# Patient Record
Sex: Female | Born: 1940 | Race: White | Hispanic: No | Marital: Married | Smoking: Never smoker
Health system: Southern US, Community
[De-identification: ages and names within clinical notes are randomized; demographics above are authoritative.]

## PROBLEM LIST (undated history)

## (undated) DIAGNOSIS — H409 Unspecified glaucoma: Secondary | ICD-10-CM

## (undated) DIAGNOSIS — D649 Anemia, unspecified: Secondary | ICD-10-CM

## (undated) HISTORY — PX: OTHER SURGICAL HISTORY: SHX169

## (undated) HISTORY — DX: Unspecified glaucoma: H40.9

## (undated) HISTORY — PX: SHOULDER SURGERY: SHX246

## (undated) HISTORY — DX: Anemia, unspecified: D64.9

## (undated) HISTORY — PX: SPINE SURGERY: SHX786

---

## 2008-01-05 ENCOUNTER — Ambulatory Visit: Payer: Self-pay | Admitting: Family Medicine

## 2010-01-31 IMAGING — CR DG FOOT COMPLETE 3+V*L*
1 series · 3 of 3 positions shown · non-contrast
Comparison: none

REASON FOR EXAM: pain r/o FB
COMMENTS:

[Series 1: view not recorded · 0.17mm/px · 3 of 3 slices shown]
[im 1/3]
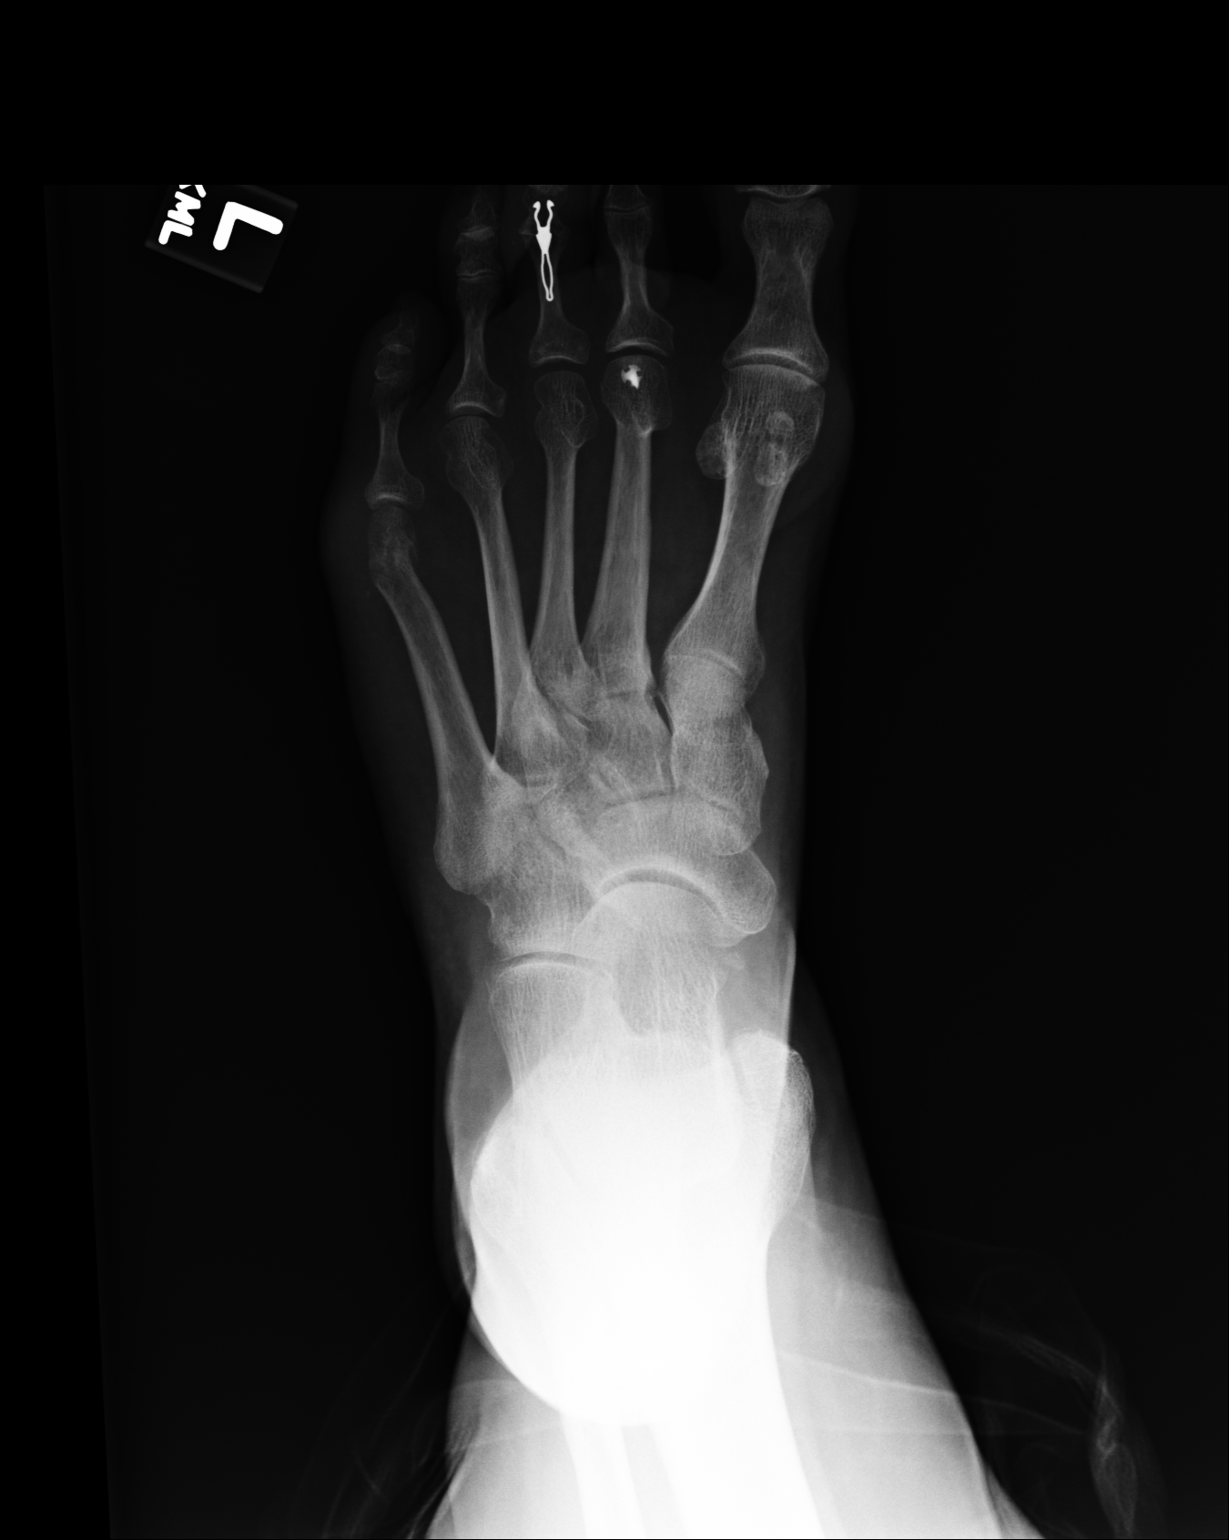
[im 2/3]
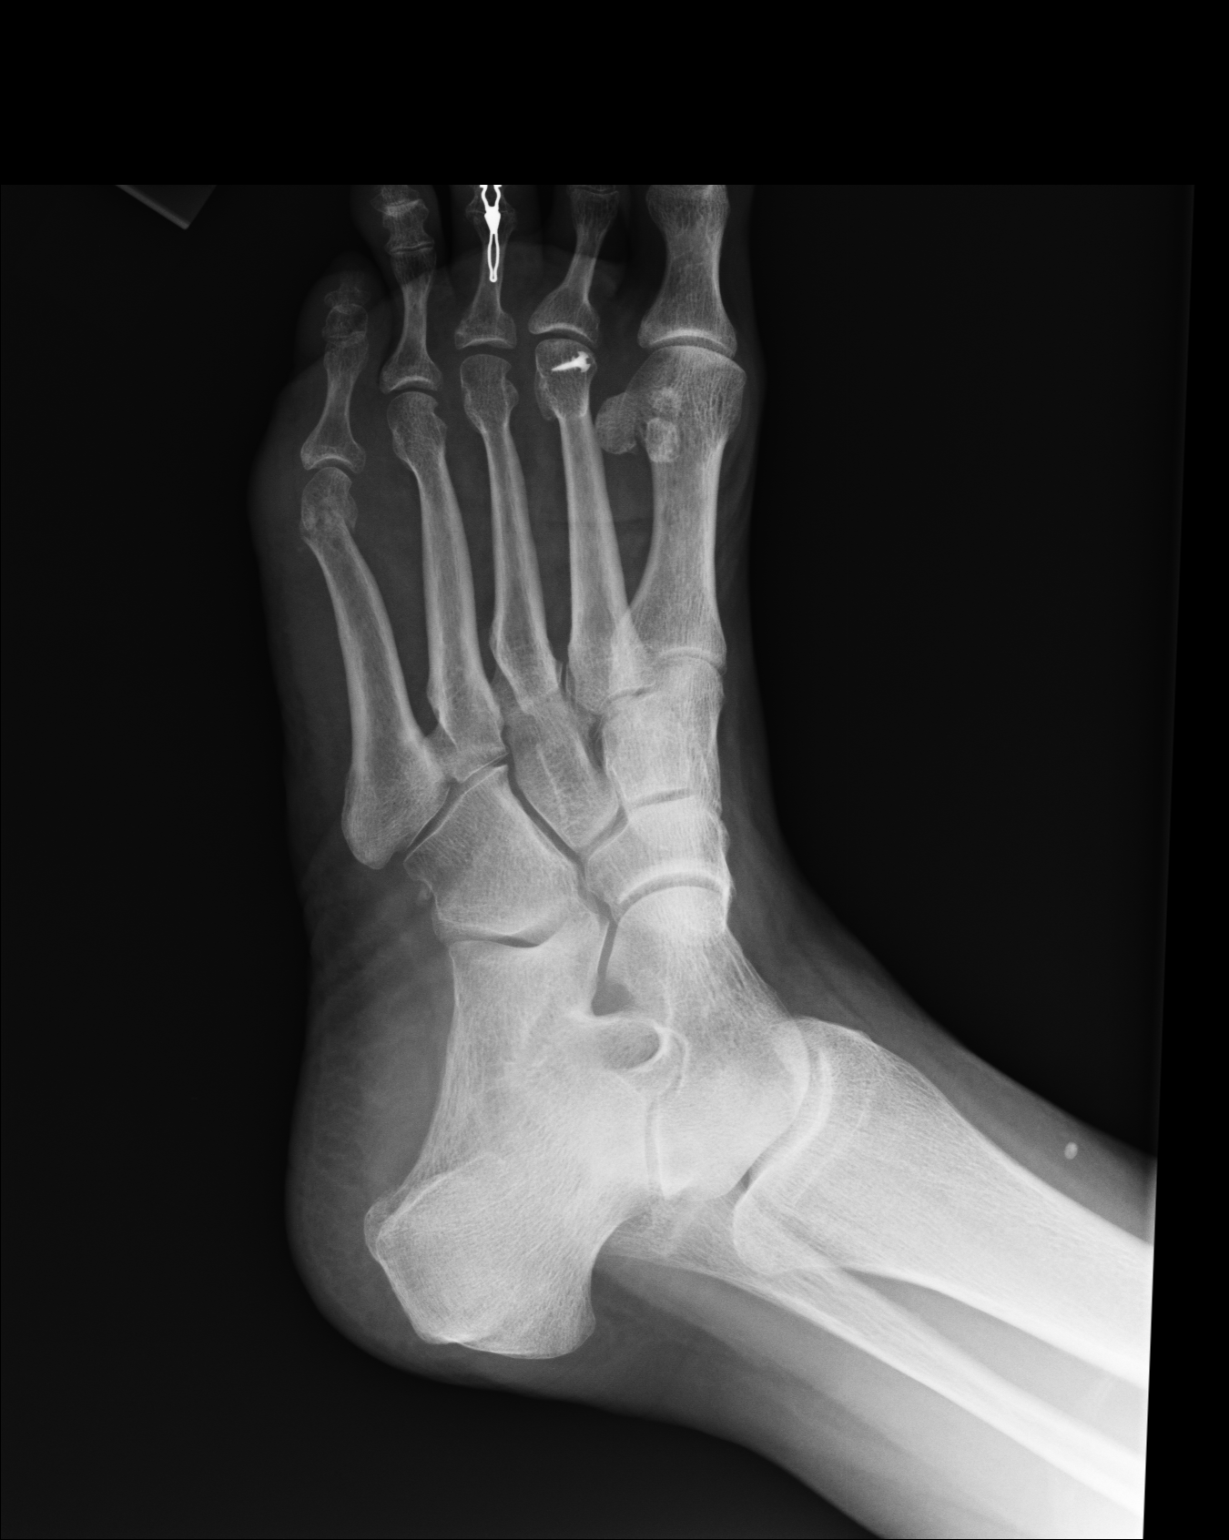
[im 3/3]
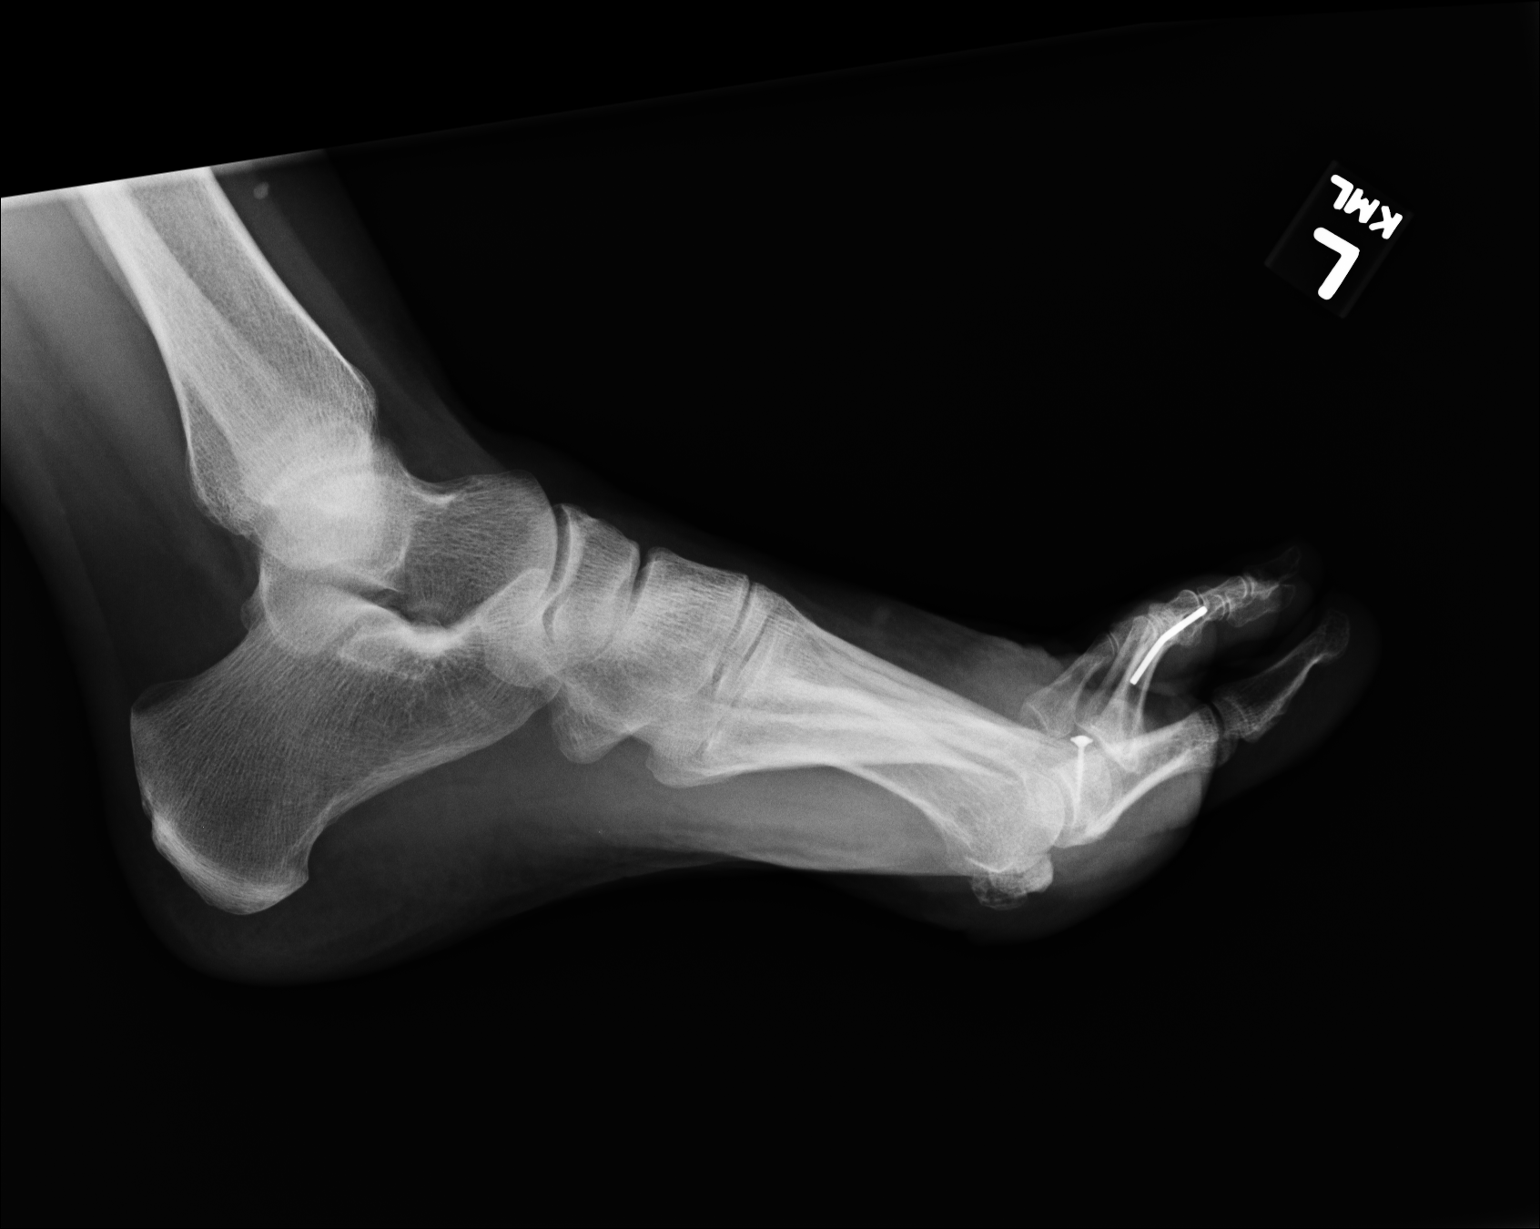

[3 of 3 positions shown; findings below may reference images not displayed]

PROCEDURE:     MDR - MDR FOOT LT COMP W/OBLQUES  - January 05, 2008 [DATE]

RESULT:     The patient is complaining of foot pain. The history is limited
otherwise. There has been prior surgery involving the proximal and middle
phalanges of the third toe. There is radiodense material that appears to
reflect a metallic screw through the head of the second metatarsal. I do not
see other foreign bodies in the soft tissues of the foot. There is likely a
phlebolith in the anterior aspect of the lower shin. There is an abnormal
appearance of the head of the fifth metatarsal. This may reflect an acute
fracture in the appropriate clinical setting.
IMPRESSION: 1. I am worried that there may be a fracture of the distal aspect of the
fifth metatarsal.
2. There are postsurgical changes with metallic screws and wires present
involving the third toe and the head of the second metatarsal. No other
foreign bodies are identified.

## 2014-03-25 ENCOUNTER — Ambulatory Visit: Payer: Medicare Other | Admitting: Podiatrist

## 2014-04-15 ENCOUNTER — Encounter: Payer: Self-pay | Admitting: Podiatrist

## 2014-04-15 ENCOUNTER — Ambulatory Visit (INDEPENDENT_AMBULATORY_CARE_PROVIDER_SITE_OTHER): Payer: Medicare Other | Admitting: Podiatrist

## 2014-04-15 VITALS — BP 149/86 | HR 79 | Resp 10 | Ht 66.0 in | Wt 180.0 lb

## 2014-04-15 DIAGNOSIS — L84 Corns and callosities: Secondary | ICD-10-CM

## 2014-04-15 DIAGNOSIS — M216X2 Other acquired deformities of left foot: Secondary | ICD-10-CM | POA: Diagnosis not present

## 2014-04-15 DIAGNOSIS — Q828 Other specified congenital malformations of skin: Secondary | ICD-10-CM | POA: Diagnosis not present

## 2014-04-15 NOTE — Progress Notes (Deleted)
   Subjective:    Patient ID: Danielle Compton, female    DOB: 1940/04/15, 74 y.o.   MRN: [id number]  HPI    Review of Systems  All other systems reviewed and are negative.      Objective:   Physical Exam        Assessment & Plan:

## 2014-04-28 NOTE — Progress Notes (Signed)
Chief Complaint  Patient presents with  . Callouses    "My left foot has pain in it." left 4th plantar inter MPJ space     HPI: Patient is 74 y.o. female who presents today for pain submet 4 of the left foot.  No trauma or injury related.   Review of systems is negative.   No Known Allergies  Physical Exam  Patient is awake, alert, and oriented x 3.  In no acute distress.  Vascular status is intact with palpable pedal pulses at 2/4 DP and PT bilateral and capillary refill time within normal limits. Neurological sensation is also intact bilaterally via Semmes Weinstein monofilament at 5/5 sites. Light touch, vibratory sensation, Achilles tendon reflex is intact. Dermatological exam reveals skin color, turger and texture as normal. No open lesions present.  Musculature intact with dorsiflexion, plantarflexion, inversion, eversion.  Plantar flexed metatarsal is present left 4th metatarsal head. Callus is present in the area as well. Intact integument is present post debridement.   Assessment: callus submet 4 left  Plan: debridement of lesion accomplished. Recommended padding and offloading.  She will be seen back prn.

## 2014-05-27 ENCOUNTER — Ambulatory Visit: Payer: Medicare Other | Admitting: Podiatrist

## 2017-10-29 ENCOUNTER — Other Ambulatory Visit: Payer: Self-pay | Admitting: Orthopedic Surgery

## 2017-10-29 DIAGNOSIS — M48061 Spinal stenosis, lumbar region without neurogenic claudication: Secondary | ICD-10-CM

## 2017-10-29 DIAGNOSIS — M545 Low back pain, unspecified: Secondary | ICD-10-CM

## 2017-10-29 DIAGNOSIS — M4326 Fusion of spine, lumbar region: Secondary | ICD-10-CM

## 2017-11-02 ENCOUNTER — Ambulatory Visit: Payer: Self-pay

## 2017-11-05 ENCOUNTER — Ambulatory Visit
Admission: RE | Admit: 2017-11-05 | Discharge: 2017-11-05 | Disposition: A | Discharge status: Home / Self Care | Payer: Medicare Other | Source: Ambulatory Visit | Attending: Orthopedic Surgery | Admitting: Orthopedic Surgery

## 2017-11-05 DIAGNOSIS — I700 Atherosclerosis of aorta: Secondary | ICD-10-CM | POA: Diagnosis not present

## 2017-11-05 DIAGNOSIS — M545 Low back pain, unspecified: Secondary | ICD-10-CM

## 2017-11-05 DIAGNOSIS — M48061 Spinal stenosis, lumbar region without neurogenic claudication: Secondary | ICD-10-CM | POA: Insufficient documentation

## 2017-11-05 DIAGNOSIS — M4316 Spondylolisthesis, lumbar region: Secondary | ICD-10-CM | POA: Diagnosis not present

## 2017-11-05 DIAGNOSIS — M4326 Fusion of spine, lumbar region: Secondary | ICD-10-CM | POA: Diagnosis present

## 2019-12-02 IMAGING — CT CT L SPINE W/O CM
3 of 5 series · 12 of 33 positions shown, 14 images · non-contrast
Comparison: None.

CLINICAL DATA: 77 y/o F; lower back pain radiating to the pelvis.
History of lumbar spinal fusion at L4-5 and L5-S1 in 3891.

EXAM:
CT LUMBAR SPINE WITHOUT CONTRAST
TECHNIQUE: Multidetector CT imaging of the lumbar spine was performed without
intravenous contrast administration. Multiplanar CT image
reconstructions were also generated.

[Series 5: lspine st l-spine · axial · 0.35mm/px · z∈[-1484,-1322]mm · 4 of 123 slices shown, 5 images]
[im 21/123  soft-tissue]
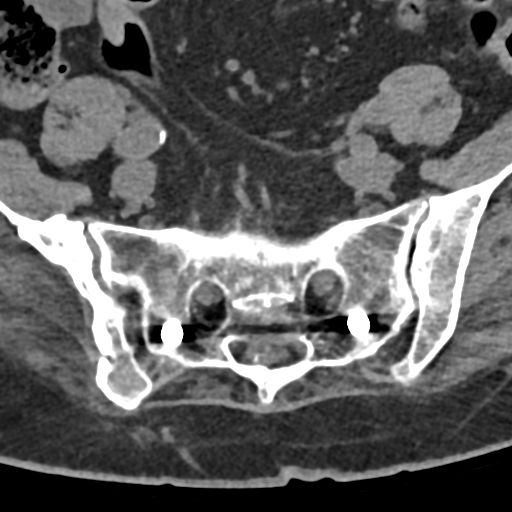
[im 21/123  bone]
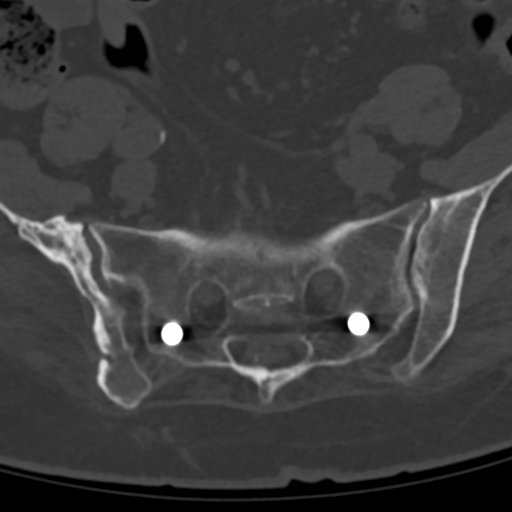
[im 41/123  bone]
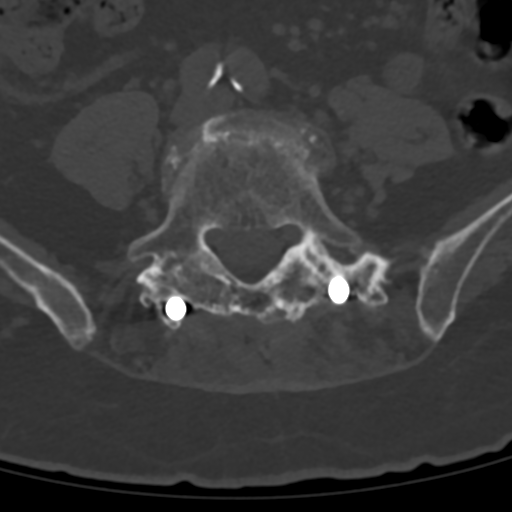
[im 82/123  bone]
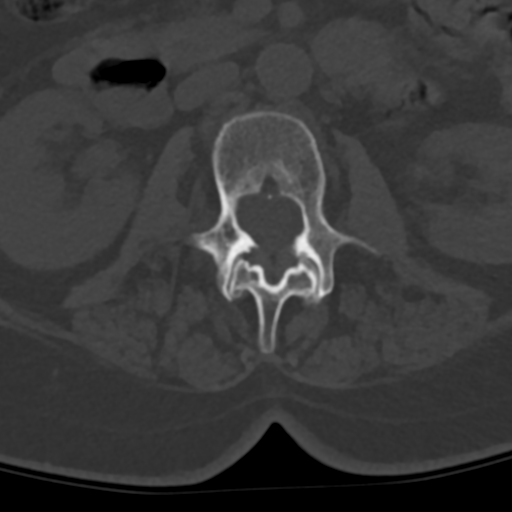
[im 102/123  bone]
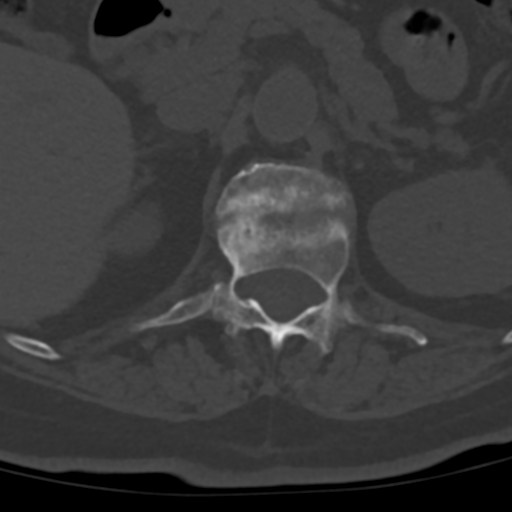

[Series 7: sag bone l-spine · sagittal · 0.35mm/px · 5 of 87 slices shown, 6 images]
[im 29/87  bone]
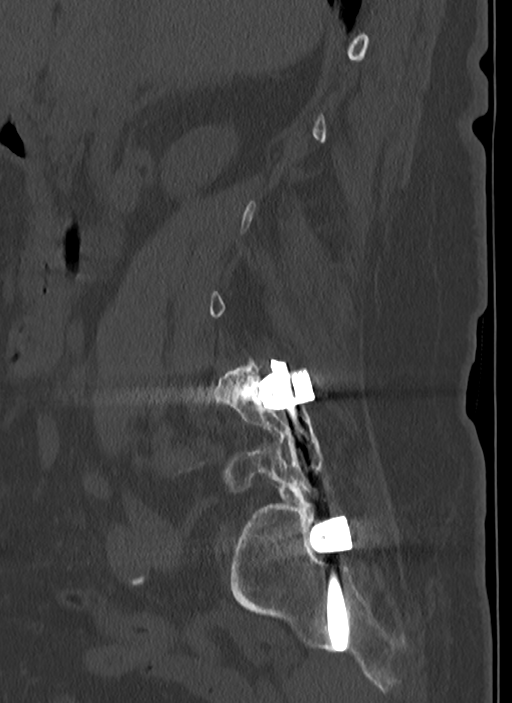
[im 36/87  bone]
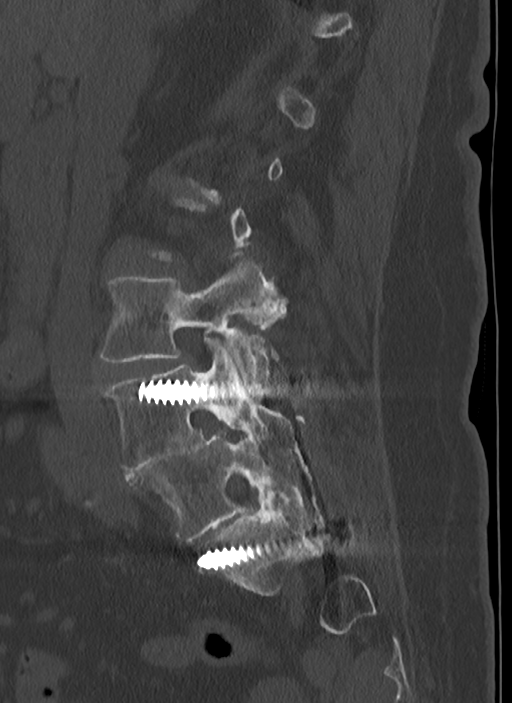
[im 44/87  soft-tissue]
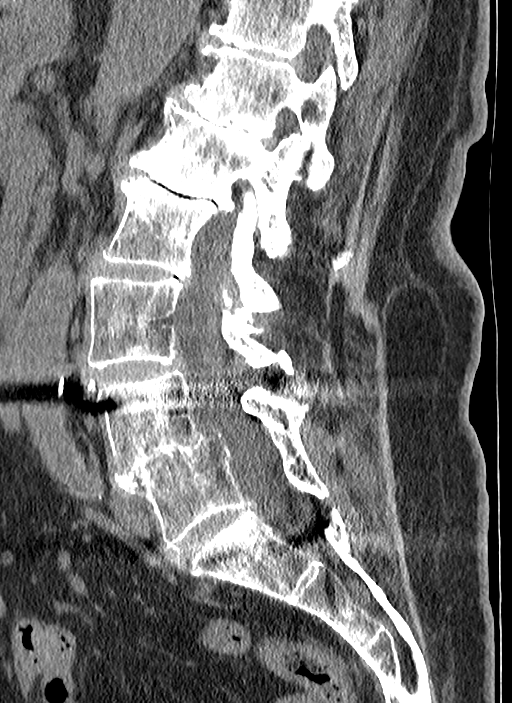
[im 44/87  bone]
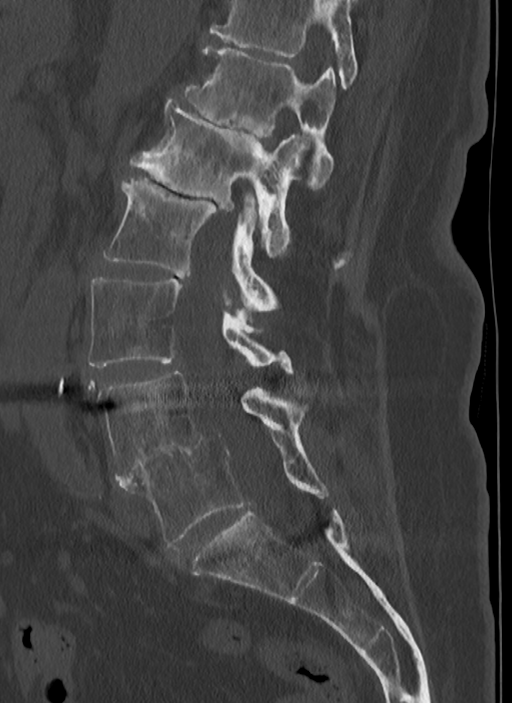
[im 51/87  bone]
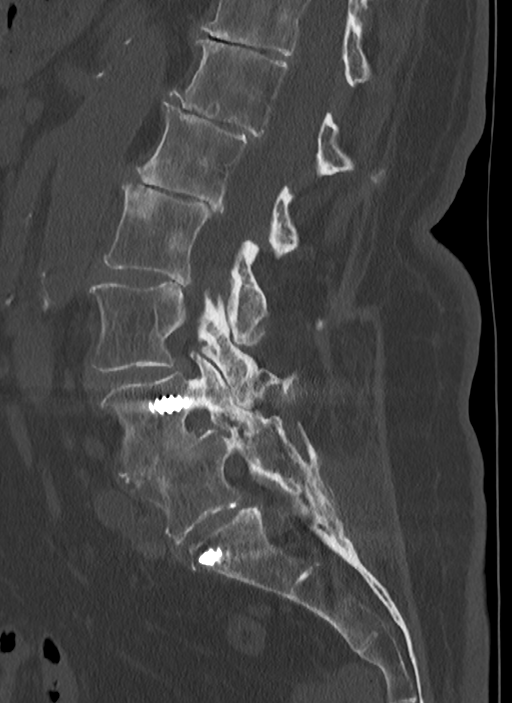
[im 58/87  bone]
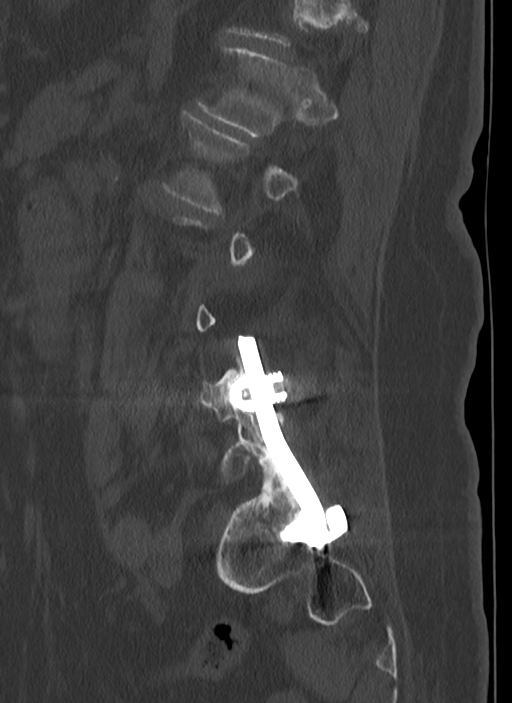

[Series 9: cor bone l-spine · coronal · 0.35mm/px · 3 of 89 slices shown]
[im 23/89  bone]
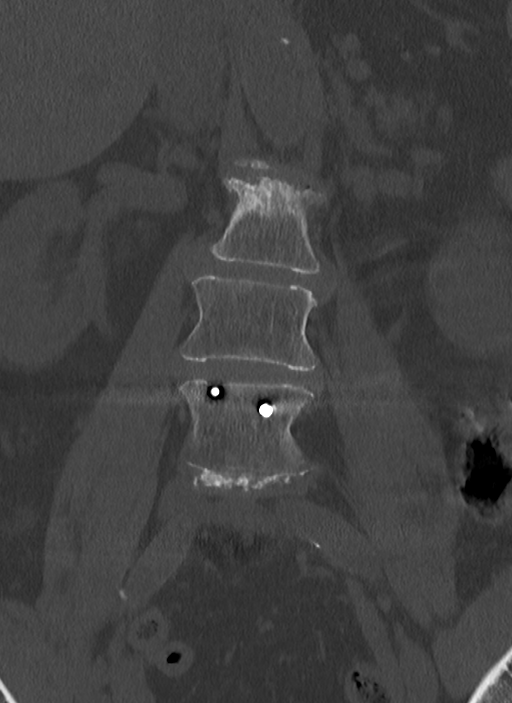
[im 45/89  bone]
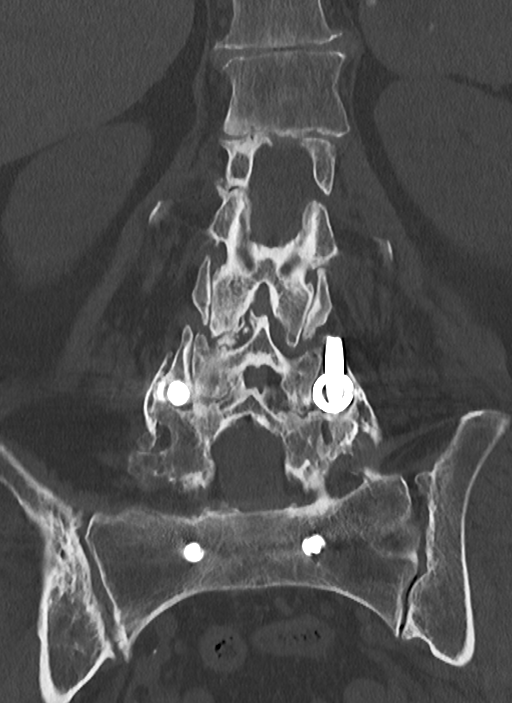
[im 67/89  bone]
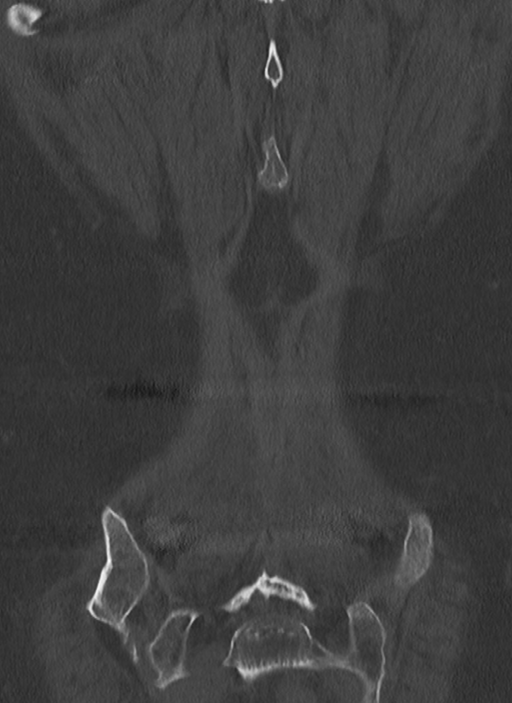

[12 of 33 positions shown; findings below may reference images not displayed]

FINDINGS: Segmentation: 5 lumbar type vertebrae.

Alignment: Mild lumbar spine dextrocurvature with apex at L4. Grade
1 anterolisthesis at L3-4 and L4-5.

Vertebrae: L4-S1 posterior instrumented fusion with paired pedicle
screws at L4 and S1 and vertical rods which extend into the sacral
ala bilaterally. No periprosthetic lucency or fracture. Hardware is
intact. Arthrodesis of the posterolateral elements with solid fusion
from L4-S1. L4-5 interbody fusion. Deformity and sclerosis of the
right iliac crest, possibly sequelae of prior trauma or donor site
for morselized bone graft material.

Paraspinal and other soft tissues: Partially visualized peripherally
calcified cysts within the left spleen measuring up to 3.7 cm.
Abdominal aortic calcific atherosclerosis.

Disc levels: Moderate to severe loss of intervertebral disc space
height at the T11 through L2 levels. Prominent bilateral facet
hypertrophy greatest at L2-3 and L3-4.

Disc displacement, endplate marginal osteophytes, and facet
hypertrophy result in mild left-sided foraminal stenosis at the
L1-2, L2-3, and L4-5 levels. On the right there is mild foraminal
stenosis at L2-3 and L4-5 as well as moderate foraminal stenosis at
L1-2. No high-grade bony canal stenosis.
IMPRESSION: 1. No acute osseous abnormality identified.
2. L4-S1 posterior instrumented fusion without apparent hardware
related complication.
3. Mild lumbar dextrocurvature. Grade 1 anterolisthesis at L3-4 and
L4-5.
4. Lumbar spondylosis with multilevel mild to moderate bony neural
foraminal stenosis. No high-grade bony canal stenosis.
5. Partially visualized rim calcified cystic lesion in the spleen.
CT or MRI of the abdomen is recommended with and without contrast
for further characterization.
6.  Aortic Atherosclerosis ([id number]).
